# Patient Record
Sex: Female | Born: 1955 | Hispanic: No | Marital: Married | Smoking: Never smoker
Health system: Southern US, Community
[De-identification: ages and names within clinical notes are randomized; demographics above are authoritative.]

## PROBLEM LIST (undated history)

## (undated) DIAGNOSIS — M5126 Other intervertebral disc displacement, lumbar region: Secondary | ICD-10-CM

## (undated) DIAGNOSIS — E785 Hyperlipidemia, unspecified: Secondary | ICD-10-CM

## (undated) HISTORY — PX: ABDOMINAL HYSTERECTOMY: SHX81

## (undated) HISTORY — PX: HYSTEROTOMY: SHX1776

---

## 2017-02-13 ENCOUNTER — Emergency Department (HOSPITAL_COMMUNITY): Payer: No Typology Code available for payment source

## 2017-02-13 ENCOUNTER — Encounter (HOSPITAL_COMMUNITY): Payer: Self-pay | Admitting: Emergency Medicine

## 2017-02-13 ENCOUNTER — Emergency Department (HOSPITAL_COMMUNITY)
Admission: EM | Admit: 2017-02-13 | Discharge: 2017-02-13 | Disposition: A | Discharge status: Home / Self Care | Payer: No Typology Code available for payment source | Attending: Emergency Medicine | Admitting: Emergency Medicine

## 2017-02-13 DIAGNOSIS — Y999 Unspecified external cause status: Secondary | ICD-10-CM | POA: Diagnosis not present

## 2017-02-13 DIAGNOSIS — Y92009 Unspecified place in unspecified non-institutional (private) residence as the place of occurrence of the external cause: Secondary | ICD-10-CM | POA: Insufficient documentation

## 2017-02-13 DIAGNOSIS — R791 Abnormal coagulation profile: Secondary | ICD-10-CM | POA: Diagnosis not present

## 2017-02-13 DIAGNOSIS — S4992XA Unspecified injury of left shoulder and upper arm, initial encounter: Secondary | ICD-10-CM | POA: Diagnosis present

## 2017-02-13 DIAGNOSIS — R935 Abnormal findings on diagnostic imaging of other abdominal regions, including retroperitoneum: Secondary | ICD-10-CM | POA: Insufficient documentation

## 2017-02-13 DIAGNOSIS — M545 Low back pain: Secondary | ICD-10-CM | POA: Insufficient documentation

## 2017-02-13 DIAGNOSIS — S40812A Abrasion of left upper arm, initial encounter: Secondary | ICD-10-CM | POA: Insufficient documentation

## 2017-02-13 DIAGNOSIS — Y9301 Activity, walking, marching and hiking: Secondary | ICD-10-CM | POA: Insufficient documentation

## 2017-02-13 DIAGNOSIS — M7918 Myalgia, other site: Secondary | ICD-10-CM

## 2017-02-13 HISTORY — DX: Hyperlipidemia, unspecified: E78.5

## 2017-02-13 HISTORY — DX: Other intervertebral disc displacement, lumbar region: M51.26

## 2017-02-13 LAB — CBC
HEMATOCRIT: 41.4 % (ref 36.0–46.0)
Hemoglobin: 13.9 g/dL (ref 12.0–15.0)
MCH: 30 pg (ref 26.0–34.0)
MCHC: 33.6 g/dL (ref 30.0–36.0)
MCV: 89.4 fL (ref 78.0–100.0)
PLATELETS: 215 10*3/uL (ref 150–400)
RBC: 4.63 MIL/uL (ref 3.87–5.11)
RDW: 13.4 % (ref 11.5–15.5)
WBC: 8.6 10*3/uL (ref 4.0–10.5)

## 2017-02-13 LAB — COMPREHENSIVE METABOLIC PANEL
ALBUMIN: 4.4 g/dL (ref 3.5–5.0)
ALT: 31 U/L (ref 14–54)
AST: 35 U/L (ref 15–41)
Alkaline Phosphatase: 89 U/L (ref 38–126)
Anion gap: 11 (ref 5–15)
BILIRUBIN TOTAL: 0.8 mg/dL (ref 0.3–1.2)
BUN: 17 mg/dL (ref 6–20)
CHLORIDE: 104 mmol/L (ref 101–111)
CO2: 24 mmol/L (ref 22–32)
CREATININE: 0.92 mg/dL (ref 0.44–1.00)
Calcium: 9.6 mg/dL (ref 8.9–10.3)
GFR calc Af Amer: >60 mL/min (ref >60)
GFR calc non Af Amer: >60 mL/min (ref >60)
GLUCOSE: 90 mg/dL (ref 65–99)
POTASSIUM: 3.9 mmol/L (ref 3.5–5.1)
Sodium: 139 mmol/L (ref 135–145)
Total Protein: 7.9 g/dL (ref 6.5–8.1)

## 2017-02-13 LAB — I-STAT CHEM 8, ED
BUN: 22 mg/dL — AB (ref 6–20)
CREATININE: 0.9 mg/dL (ref 0.44–1.00)
Calcium, Ion: 1.18 mmol/L (ref 1.15–1.40)
Chloride: 106 mmol/L (ref 101–111)
Glucose, Bld: 91 mg/dL (ref 65–99)
HEMATOCRIT: 42 % (ref 36.0–46.0)
HEMOGLOBIN: 14.3 g/dL (ref 12.0–15.0)
POTASSIUM: 3.8 mmol/L (ref 3.5–5.1)
SODIUM: 142 mmol/L (ref 135–145)
TCO2: 25 mmol/L (ref 0–100)

## 2017-02-13 LAB — SAMPLE TO BLOOD BANK

## 2017-02-13 LAB — ETHANOL: Alcohol, Ethyl (B): <5 mg/dL (ref <5)

## 2017-02-13 LAB — PROTIME-INR
INR: 0.94
Prothrombin Time: 12.5 seconds (ref 11.4–15.2)

## 2017-02-13 LAB — CDS SEROLOGY

## 2017-02-13 LAB — I-STAT CG4 LACTIC ACID, ED: Lactic Acid, Venous: 1.29 mmol/L (ref 0.5–1.9)

## 2017-02-13 MED ORDER — OXYCODONE-ACETAMINOPHEN 5-325 MG PO TABS: 1.0000 | ORAL_TABLET | Freq: Four times a day (QID) | ORAL | 0 refills | Status: AC | PRN

## 2017-02-13 MED ORDER — IOPAMIDOL (ISOVUE-300) INJECTION 61%
INTRAVENOUS | Status: AC
  Administered 2017-02-13: 100 mL
  Filled 2017-02-13: qty 100

## 2017-02-13 MED ORDER — MORPHINE SULFATE (PF) 4 MG/ML IV SOLN
4.0000 mg | Freq: Once | INTRAVENOUS | Status: AC
  Administered 2017-02-13: 4 mg via INTRAVENOUS
  Filled 2017-02-13: qty 1

## 2017-02-13 MED ORDER — CYCLOBENZAPRINE HCL 10 MG PO TABS: 10.0000 mg | ORAL_TABLET | Freq: Two times a day (BID) | ORAL | 0 refills | Status: DC | PRN

## 2017-02-13 MED ORDER — ONDANSETRON HCL 4 MG/2ML IJ SOLN
4.0000 mg | Freq: Once | INTRAMUSCULAR | Status: AC
  Administered 2017-02-13: 4 mg via INTRAVENOUS
  Filled 2017-02-13: qty 2
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
## 2017-02-13 NOTE — ED Notes (Signed)
Pt ambulated approx 12 steps with standby assist. Pt had balanced, steady gait and denied any dizziness, weakness, lightheadedness. Pt noted to have difficulty and experienced pain in R hip while ambulating.
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
## 2017-02-13 NOTE — ED Notes (Signed)
Patient transported to CT 

## 2020-05-26 ENCOUNTER — Other Ambulatory Visit: Payer: Self-pay | Admitting: Physician Assistant

## 2020-05-26 DIAGNOSIS — M549 Dorsalgia, unspecified: Secondary | ICD-10-CM

## 2020-06-01 ENCOUNTER — Other Ambulatory Visit: Payer: Self-pay | Admitting: Physician Assistant

## 2020-06-01 DIAGNOSIS — I720 Aneurysm of carotid artery: Secondary | ICD-10-CM

## 2020-06-06 ENCOUNTER — Ambulatory Visit
Admission: RE | Admit: 2020-06-06 | Discharge: 2020-06-06 | Disposition: A | Discharge status: Home / Self Care | Payer: BLUE CROSS/BLUE SHIELD | Source: Ambulatory Visit | Attending: Physician Assistant | Admitting: Physician Assistant

## 2020-06-06 ENCOUNTER — Other Ambulatory Visit: Payer: Self-pay

## 2020-06-06 DIAGNOSIS — I720 Aneurysm of carotid artery: Secondary | ICD-10-CM

## 2020-06-06 MED ORDER — IOPAMIDOL (ISOVUE-370) INJECTION 76%
75.0000 mL | Freq: Once | INTRAVENOUS | Status: AC | PRN
  Administered 2020-06-06: 75 mL via INTRAVENOUS

## 2020-07-06 ENCOUNTER — Ambulatory Visit
Admission: RE | Admit: 2020-07-06 | Discharge: 2020-07-06 | Disposition: A | Discharge status: Home / Self Care | Payer: Managed Care, Other (non HMO) | Source: Ambulatory Visit | Attending: Physician Assistant | Admitting: Physician Assistant

## 2020-07-06 ENCOUNTER — Other Ambulatory Visit: Payer: Self-pay

## 2020-07-06 DIAGNOSIS — M549 Dorsalgia, unspecified: Secondary | ICD-10-CM

## 2021-07-05 ENCOUNTER — Other Ambulatory Visit: Payer: Self-pay | Admitting: Neurosurgery

## 2021-07-05 DIAGNOSIS — I720 Aneurysm of carotid artery: Secondary | ICD-10-CM

## 2021-07-20 ENCOUNTER — Other Ambulatory Visit: Payer: Self-pay

## 2021-07-20 ENCOUNTER — Ambulatory Visit
Admission: RE | Admit: 2021-07-20 | Discharge: 2021-07-20 | Disposition: A | Discharge status: Home / Self Care | Payer: Medicare HMO | Source: Ambulatory Visit | Attending: Neurosurgery | Admitting: Neurosurgery

## 2021-07-20 DIAGNOSIS — I720 Aneurysm of carotid artery: Secondary | ICD-10-CM

## 2021-07-20 MED ORDER — GADOBENATE DIMEGLUMINE 529 MG/ML IV SOLN
15.0000 mL | Freq: Once | INTRAVENOUS | Status: AC | PRN
  Administered 2021-07-20: 15 mL via INTRAVENOUS

## 2021-12-13 ENCOUNTER — Other Ambulatory Visit: Payer: Self-pay | Admitting: Obstetrics and Gynecology

## 2021-12-13 DIAGNOSIS — E2839 Other primary ovarian failure: Secondary | ICD-10-CM

## 2022-03-28 ENCOUNTER — Encounter: Payer: Self-pay | Admitting: Student

## 2022-03-28 ENCOUNTER — Ambulatory Visit: Payer: Medicare HMO | Admitting: Student

## 2022-03-28 VITALS — BP 130/87 | HR 72 | Temp 98.0°F | Resp 16 | Ht 65.0 in | Wt 142.0 lb

## 2022-03-28 DIAGNOSIS — E7800 Pure hypercholesterolemia, unspecified: Secondary | ICD-10-CM

## 2022-03-28 DIAGNOSIS — R0609 Other forms of dyspnea: Secondary | ICD-10-CM

## 2022-03-28 DIAGNOSIS — R072 Precordial pain: Secondary | ICD-10-CM

## 2022-03-28 NOTE — Progress Notes (Signed)
? ? ?Primary Physician/Referring:  Chesley Noon, MD ? ?Patient ID: Stacy Spencer, female    DOB: 11/21/1956, 66 y.o.   MRN: [id number] ? ?Chief Complaint  ?Patient presents with  ? Chest Pain  ? New Patient (Initial Visit)  ? ?HPI:   ? ?Stacy Spencer  is a 66 y.o. 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  She denies history of diabetes, tobacco use, hypertension (on metoprolol for PAC/PVCs).  Patient has history of cervical spine fusion as well as hysterectomy.  Patient's brother with likely MI at about age 64. ? ?Patient states she was seen by cardiologist in [place] in 2018 at which point she underwent cardiac catheterization, but reportedly did not receive stents.  At that time she also states she was referred to a cardiac electrophysiologist who started her on metoprolol. ? ?Patient now presents to our office for evaluation of intermittent chest pain as well as dyspnea on exertion over the last 3 months.  Patient describes substernal chest pain as "gnawing" occurring both at rest as well as occasionally with exertion.  Pain lasts less than 30 minutes and was relieved by aspirin.  She states she also has intermittent dyspnea on exertion when she goes up the stairs, however also reports it does not happen every time". ? ?Past Medical History:  ?Diagnosis Date  ? Hyperlipemia   ? Lumbar herniated disc   ? ?Past Surgical History:  ?Procedure Laterality Date  ? ABDOMINAL HYSTERECTOMY    ? HYSTEROTOMY    ? ?Family History  ?Problem Relation Age of Onset  ? Diabetes Father   ? Diabetes Sister   ? Diabetes Brother   ? Diabetes Brother   ?  ?Social History  ? ?Tobacco Use  ? Smoking status: Never  ? Smokeless tobacco: Never  ?Substance Use Topics  ? Alcohol use: Yes  ?  Comment: occ  ? ?Marital Status: Married  ? ?ROS  ?Review of Systems  ?Constitutional: Negative for malaise/fatigue and weight gain.  ?Cardiovascular:  Positive for  chest pain (intermittent) and dyspnea on exertion (occasional). Negative for claudication, leg swelling, near-syncope, orthopnea, palpitations, paroxysmal nocturnal dyspnea and syncope.  ?Neurological:  Negative for dizziness.  ? ?Objective  ?Blood pressure 130/87, pulse 72, temperature 98 ?F (36.7 ?C), resp. rate 16, height '5\' 5"'$  (1.651 m), weight 142 lb (64.4 kg), SpO2 98 %.  ? ?  03/28/2022  ? 12:40 PM 02/13/2017  ? 11:15 PM 02/13/2017  ? 11:00 PM  ?Vitals with BMI  ?Height '5\' 5"'$     ?Weight 142 lbs    ?BMI 23.63    ?Systolic 101 751 025  ?Diastolic 87 68 69  ?Pulse 72 66 66  ?  ? ? Physical Exam ?Vitals reviewed.  ?Cardiovascular:  ?   Rate and Rhythm: Normal rate and regular rhythm.  ?   Pulses: Intact distal pulses.     ?     Carotid pulses are 2+ on the right side and 2+ on the left side. ?     Dorsalis pedis pulses are 2+ on the right side and 2+ on the left side.  ?     Posterior tibial pulses are 2+ on the right side and 2+ on the left side.  ?   Heart sounds: S1 normal and S2 normal. No murmur heard. ?  No gallop.  ?Pulmonary:  ?   Effort: Pulmonary effort is normal. No respiratory distress.  ?  Breath sounds: No wheezing, rhonchi or rales.  ?Musculoskeletal:  ?   Right lower leg: No edema.  ?   Left lower leg: No edema.  ?Neurological:  ?   Mental Status: She is alert.  ? ? ?Laboratory examination:  ? ?No results for input(s): NA, K, CL, CO2, GLUCOSE, BUN, CREATININE, CALCIUM, GFRNONAA, GFRAA in the last 8760 hours. ?CrCl cannot be calculated (Patient's most recent lab result is older than the maximum 21 days allowed.).  ? ?  Latest Ref Rng & Units 02/13/2017  ?  8:37 PM 02/13/2017  ?  8:32 PM  ?CMP  ?Glucose 65 - 99 mg/dL 91   90    ?BUN 6 - 20 mg/dL 22   17    ?Creatinine 0.44 - 1.00 mg/dL 0.90   0.92    ?Sodium 135 - 145 mmol/L 142   139    ?Potassium 3.5 - 5.1 mmol/L 3.8   3.9    ?Chloride 101 - 111 mmol/L 106   104    ?CO2 22 - 32 mmol/L  24    ?Calcium 8.9 - 10.3 mg/dL  9.6    ?Total Protein 6.5 - 8.1  g/dL  7.9    ?Total Bilirubin 0.3 - 1.2 mg/dL  0.8    ?Alkaline Phos 38 - 126 U/L  89    ?AST 15 - 41 U/L  35    ?ALT 14 - 54 U/L  31    ? ? ?  Latest Ref Rng & Units 02/13/2017  ?  8:37 PM 02/13/2017  ?  8:32 PM  ?CBC  ?WBC 4.0 - 10.5 K/uL  8.6    ?Hemoglobin 12.0 - 15.0 g/dL 14.3   13.9    ?Hematocrit 36.0 - 46.0 % 42.0   41.4    ?Platelets 150 - 400 K/uL  215    ? ? ?Lipid Panel ?No results for input(s): CHOL, TRIG, LDLCALC, VLDL, HDL, CHOLHDL, LDLDIRECT in the last 8760 hours. ? ?HEMOGLOBIN A1C ?No results found for: HGBA1C, MPG ?TSH ?No results for input(s): TSH in the last 8760 hours. ? ?External labs:  ?03/27/2022: ?BUN 11, creatinine 0.89, GFR >60, sodium 142, potassium 4.3, AST 21, ALT 21 ?Total cholesterol 176, triglycerides 180, HDL 40, LDL 103 ?TSH 1.38 ?Vitamin D38.8 ? ?09/25/2021: ?Total cholesterol 186, triglycerides 153, HDL 46, LDL 113 ? ?Allergies  ?No Known Allergies  ? ? ?Medications Prior to Visit:  ? ?Outpatient Medications Prior to Visit  ?Medication Sig Dispense Refill  ? acetaminophen (TYLENOL) 500 MG tablet Take by mouth.    ? amitriptyline (ELAVIL) 25 MG tablet Take 25 mg by mouth at bedtime.    ? ascorbic acid (VITAMIN C) 1000 MG tablet Take 1,000 mg by mouth daily.    ? Aspirin (VAZALORE) 81 MG CAPS Take 1 tablet by mouth daily at 6 (six) AM.    ? atorvastatin (LIPITOR) 20 MG tablet Take 20 mg by mouth every evening.    ? Cholecalciferol 125 MCG (5000 UT) capsule Take 5,000 Units by mouth daily.    ? Docusate Sodium 100 MG capsule Take by mouth.    ? metoprolol succinate (TOPROL-XL) 50 MG 24 hr tablet Take 50 mg by mouth daily.    ? traZODone (DESYREL) 50 MG tablet Take 1/2 tablet ('25mg'$ ) to 1 tablet ('50mg'$ ) nightly as needed for insomnia    ? albuterol (PROVENTIL HFA;VENTOLIN HFA) 108 (90 Base) MCG/ACT inhaler Inhale 1-2 puffs into the lungs every 6 (six) hours as needed for wheezing  or shortness of breath.    ? cyclobenzaprine (FLEXERIL) 10 MG tablet Take 1 tablet (10 mg total) by mouth 2  (two) times daily as needed for muscle spasms. 20 tablet 0  ? ?No facility-administered medications prior to visit.  ? ?Final Medications at End of Visit   ? ?Current Meds  ?Medication Sig  ? acetaminophen (TYLENOL) 500 MG tablet Take by mouth.  ? amitriptyline (ELAVIL) 25 MG tablet Take 25 mg by mouth at bedtime.  ? ascorbic acid (VITAMIN C) 1000 MG tablet Take 1,000 mg by mouth daily.  ? Aspirin (VAZALORE) 81 MG CAPS Take 1 tablet by mouth daily at 6 (six) AM.  ? atorvastatin (LIPITOR) 20 MG tablet Take 20 mg by mouth every evening.  ? Cholecalciferol 125 MCG (5000 UT) capsule Take 5,000 Units by mouth daily.  ? Docusate Sodium 100 MG capsule Take by mouth.  ? metoprolol succinate (TOPROL-XL) 50 MG 24 hr tablet Take 50 mg by mouth daily.  ? traZODone (DESYREL) 50 MG tablet Take 1/2 tablet ('25mg'$ ) to 1 tablet ('50mg'$ ) nightly as needed for insomnia  ? ?Radiology:  ? ?No results found. ? ?Cardiac Studies:  ? ?[place] records are not available  ? ?EKG:  ? ?03/28/22: Sinus rhythm at a rate of 65 bpm.  Normal axis.  Incomplete right bundle branch block.  No evidence of ischemia or underlying injury pattern.  Nonspecific T wave abnormality. ? ?Assessment  ? ?  ICD-10-CM   ?1. Precordial pain  R07.2 EKG 12-Lead  ?  PCV ECHOCARDIOGRAM COMPLETE  ?  PCV MYOCARDIAL PERFUSION WO LEXISCAN  ?  ?2. Dyspnea on exertion  R06.09   ?  ?3. Hypercholesteremia  E78.00   ?  ?  ? ?Medications Discontinued During This Encounter  ?Medication Reason  ? albuterol (PROVENTIL HFA;VENTOLIN HFA) 108 (90 Base) MCG/ACT inhaler   ? cyclobenzaprine (FLEXERIL) 10 MG tablet   ?  ?No orders of the defined types were placed in this encounter. ? ? ?Recommendations:  ? ?Stacy Spencer is a 66 y.o. 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  She denies history of diabetes, tobacco use, hypertension (on metoprolol for PAC/PVCs).  Patient has history of cervical spine fusion as well as  hysterectomy.  Patient's brother with likely MI at about age 60. ? ?Patient is referred to our office for evaluation of intermittent chest pain and dyspnea on exertion.  Given patient's symptoms and multi

## 2022-04-03 ENCOUNTER — Ambulatory Visit: Payer: Medicare HMO

## 2022-04-03 DIAGNOSIS — R072 Precordial pain: Secondary | ICD-10-CM

## 2022-04-08 NOTE — Progress Notes (Signed)
Essentially normal echo, will discuss further at upcoming office visit.

## 2022-04-08 NOTE — Progress Notes (Signed)
Called pt to inform her about her echo results. Pt understood

## 2022-04-17 ENCOUNTER — Ambulatory Visit: Payer: Medicare HMO

## 2022-04-17 DIAGNOSIS — R072 Precordial pain: Secondary | ICD-10-CM

## 2022-04-24 ENCOUNTER — Other Ambulatory Visit: Payer: Self-pay | Admitting: Gastroenterology

## 2022-05-08 NOTE — Progress Notes (Signed)
? ? ?Primary Physician/Referring:  Chesley Noon, MD ? ?Patient ID: Stacy Spencer, female    DOB: 1956-03-09, 66 y.o.   MRN: [id number] ? ?Chief Complaint  ?Patient presents with  ? Results  ? Follow-up  ? ?HPI:   ? ?Stacy Spencer  is a 66 y.o. 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  She denies history of diabetes, tobacco use, hypertension (on metoprolol for PAC/PVCs).  Patient has history of cervical spine fusion as well as hysterectomy.  Patient's brother with likely MI at about age 42. ? ?Patient states she was seen by cardiologist in [place] in 2018 at which point she underwent cardiac catheterization, but reportedly did not receive stents.  At that time she also states she was referred to a cardiac electrophysiologist who started her on metoprolol. ? ?Patient presents for 6-week follow-up of chest pain and dyspnea on exertion.  At last office visit ordered echocardiogram Which revealed normal LVEF with grade 1 diastolic dysfunction, otherwise unremarkable.  Also ordered nuclear stress test which was overall low risk with normal myocardial perfusion.  Since last office visit patient has had no recurrence of shortness of breath or chest pain. ? ?Past Medical History:  ?Diagnosis Date  ? Hyperlipemia   ? Lumbar herniated disc   ? ?Past Surgical History:  ?Procedure Laterality Date  ? ABDOMINAL HYSTERECTOMY    ? HYSTEROTOMY    ? ?Family History  ?Problem Relation Age of Onset  ? Diabetes Father   ? Diabetes Sister   ? Diabetes Brother   ? Diabetes Brother   ?  ?Social History  ? ?Tobacco Use  ? Smoking status: Never  ? Smokeless tobacco: Never  ?Substance Use Topics  ? Alcohol use: Yes  ?  Comment: occ  ? ?Marital Status: Married  ? ?ROS  ?Review of Systems  ?Constitutional: Negative for malaise/fatigue and weight gain.  ?Cardiovascular:  Negative for chest pain, claudication, dyspnea on exertion, leg swelling, near-syncope,  orthopnea, palpitations, paroxysmal nocturnal dyspnea and syncope.  ?Neurological:  Negative for dizziness.  ? ?Objective  ?Blood pressure (!) 146/89, pulse 66, temperature 98 ?F (36.7 ?C), temperature source Temporal, resp. rate 16, height '5\' 5"'$  (1.651 m), weight 147 lb (66.7 kg), SpO2 98 %.  ? ?  05/09/2022  ?  9:59 AM 05/09/2022  ?  9:55 AM 03/28/2022  ? 12:40 PM  ?Vitals with BMI  ?Height  '5\' 5"'$  '5\' 5"'$   ?Weight  147 lbs 142 lbs  ?BMI  24.46 23.63  ?Systolic 782 956 213  ?Diastolic 89 84 87  ?Pulse 66 65 72  ?  ? ? Physical Exam ?Vitals reviewed.  ?Cardiovascular:  ?   Rate and Rhythm: Normal rate and regular rhythm.  ?   Pulses: Intact distal pulses.     ?     Carotid pulses are 2+ on the right side and 2+ on the left side. ?     Dorsalis pedis pulses are 2+ on the right side and 2+ on the left side.  ?     Posterior tibial pulses are 2+ on the right side and 2+ on the left side.  ?   Heart sounds: S1 normal and S2 normal. No murmur heard. ?  No gallop.  ?Pulmonary:  ?   Effort: Pulmonary effort is normal. No respiratory distress.  ?   Breath sounds: No wheezing, rhonchi or rales.  ?Musculoskeletal:  ?  Right lower leg: No edema.  ?   Left lower leg: No edema.  ?Neurological:  ?   Mental Status: She is alert.  ?Physical exam unchanged compared to previous office visit. ? ?Laboratory examination:  ? ?No results for input(s): NA, K, CL, CO2, GLUCOSE, BUN, CREATININE, CALCIUM, GFRNONAA, GFRAA in the last 8760 hours. ?CrCl cannot be calculated (Patient's most recent lab result is older than the maximum 21 days allowed.).  ? ?  Latest Ref Rng & Units 02/13/2017  ?  8:37 PM 02/13/2017  ?  8:32 PM  ?CMP  ?Glucose 65 - 99 mg/dL 91   90    ?BUN 6 - 20 mg/dL 22   17    ?Creatinine 0.44 - 1.00 mg/dL 0.90   0.92    ?Sodium 135 - 145 mmol/L 142   139    ?Potassium 3.5 - 5.1 mmol/L 3.8   3.9    ?Chloride 101 - 111 mmol/L 106   104    ?CO2 22 - 32 mmol/L  24    ?Calcium 8.9 - 10.3 mg/dL  9.6    ?Total Protein 6.5 - 8.1 g/dL  7.9     ?Total Bilirubin 0.3 - 1.2 mg/dL  0.8    ?Alkaline Phos 38 - 126 U/L  89    ?AST 15 - 41 U/L  35    ?ALT 14 - 54 U/L  31    ? ? ?  Latest Ref Rng & Units 02/13/2017  ?  8:37 PM 02/13/2017  ?  8:32 PM  ?CBC  ?WBC 4.0 - 10.5 K/uL  8.6    ?Hemoglobin 12.0 - 15.0 g/dL 14.3   13.9    ?Hematocrit 36.0 - 46.0 % 42.0   41.4    ?Platelets 150 - 400 K/uL  215    ? ? ?Lipid Panel ?No results for input(s): CHOL, TRIG, LDLCALC, VLDL, HDL, CHOLHDL, LDLDIRECT in the last 8760 hours. ? ?HEMOGLOBIN A1C ?No results found for: HGBA1C, MPG ?TSH ?No results for input(s): TSH in the last 8760 hours. ? ?External labs:  ?03/27/2022: ?BUN 11, creatinine 0.89, GFR >60, sodium 142, potassium 4.3, AST 21, ALT 21 ?Total cholesterol 176, triglycerides 180, HDL 40, LDL 103 ?TSH 1.38 ?Vitamin D38.8 ? ?09/25/2021: ?Total cholesterol 186, triglycerides 153, HDL 46, LDL 113 ? ?Allergies  ?No Known Allergies  ? ?Medications Prior to Visit:  ? ?Outpatient Medications Prior to Visit  ?Medication Sig Dispense Refill  ? acetaminophen (TYLENOL) 500 MG tablet Take by mouth.    ? amitriptyline (ELAVIL) 25 MG tablet Take 25 mg by mouth at bedtime.    ? ascorbic acid (VITAMIN C) 1000 MG tablet Take 1,000 mg by mouth daily.    ? atorvastatin (LIPITOR) 20 MG tablet Take 20 mg by mouth every evening.    ? cetirizine (ZYRTEC) 5 MG tablet Take 5 mg by mouth daily.    ? Cholecalciferol 125 MCG (5000 UT) capsule Take 5,000 Units by mouth daily.    ? Docusate Sodium 100 MG capsule Take by mouth.    ? metoprolol succinate (TOPROL-XL) 50 MG 24 hr tablet Take 50 mg by mouth daily.    ? traZODone (DESYREL) 50 MG tablet Take 1/2 tablet ('25mg'$ ) to 1 tablet ('50mg'$ ) nightly as needed for insomnia    ? Aspirin (VAZALORE) 81 MG CAPS Take 1 tablet by mouth daily at 6 (six) AM.    ? ?No facility-administered medications prior to visit.  ? ?Final Medications at End of Visit   ? ?  Current Meds  ?Medication Sig  ? acetaminophen (TYLENOL) 500 MG tablet Take by mouth.  ? amitriptyline  (ELAVIL) 25 MG tablet Take 25 mg by mouth at bedtime.  ? ascorbic acid (VITAMIN C) 1000 MG tablet Take 1,000 mg by mouth daily.  ? atorvastatin (LIPITOR) 20 MG tablet Take 20 mg by mouth every evening.  ? cetirizine (ZYRTEC) 5 MG tablet Take 5 mg by mouth daily.  ? Cholecalciferol 125 MCG (5000 UT) capsule Take 5,000 Units by mouth daily.  ? Docusate Sodium 100 MG capsule Take by mouth.  ? metoprolol succinate (TOPROL-XL) 50 MG 24 hr tablet Take 50 mg by mouth daily.  ? traZODone (DESYREL) 50 MG tablet Take 1/2 tablet ('25mg'$ ) to 1 tablet ('50mg'$ ) nightly as needed for insomnia  ? [DISCONTINUED] Aspirin (VAZALORE) 81 MG CAPS Take 1 tablet by mouth daily at 6 (six) AM.  ? ?Radiology:  ? ?No results found. ? ?Cardiac Studies:  ? ?PCV ECHOCARDIOGRAM COMPLETE 04/03/2022 ?Left ventricle cavity is normal in size and wall thickness. Normal global wall motion. Normal LV systolic function with EF 63%. Doppler evidence of grade I (impaired) diastolic dysfunction, normal LAP. ?Structurally normal trileaflet aortic valve.  Trace aortic regurgitation. ?Normal right atrial pressure. ?  ?PCV MYOCARDIAL PERFUSION WO LEXISCAN 04/17/2022 ?Exercise nuclear stress test was performed using Bruce protocol. Patient reached 7 METS, and 105% of age predicted maximum heart rate. Exercise capacity was low. No chest pain reported. Heart rate and hemodynamic response were normal. Stress EKG revealed no ischemic changes. ?Normal myocardial perfusion. Stress LVEF 87%. ?Low risk study. ? ?EKG:  ? ?03/28/22: Sinus rhythm at a rate of 65 bpm.  Normal axis.  Incomplete right bundle branch block.  No evidence of ischemia or underlying injury pattern.  Nonspecific T wave abnormality. ? ?Assessment  ? ?  ICD-10-CM   ?1. Precordial pain  R07.2   ?  ?2. Dyspnea on exertion  R06.09   ?  ?  ? ?Medications Discontinued During This Encounter  ?Medication Reason  ? Aspirin (VAZALORE) 81 MG CAPS Discontinued by provider  ?  ?No orders of the defined types were  placed in this encounter. ? ? ?Recommendations:  ? ?Stacy Spencer is a 66 y.o. 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  She denies

## 2022-05-09 ENCOUNTER — Encounter: Payer: Self-pay | Admitting: Student

## 2022-05-09 ENCOUNTER — Ambulatory Visit: Payer: Medicare HMO | Admitting: Student

## 2022-05-09 VITALS — BP 146/89 | HR 66 | Temp 98.0°F | Resp 16 | Ht 65.0 in | Wt 147.0 lb

## 2022-05-09 DIAGNOSIS — R072 Precordial pain: Secondary | ICD-10-CM

## 2022-05-09 DIAGNOSIS — R0609 Other forms of dyspnea: Secondary | ICD-10-CM

## 2022-06-04 ENCOUNTER — Other Ambulatory Visit: Payer: Medicare HMO

## 2022-07-10 ENCOUNTER — Other Ambulatory Visit: Payer: Self-pay | Admitting: Gastroenterology

## 2022-07-19 ENCOUNTER — Encounter (HOSPITAL_COMMUNITY): Payer: Self-pay | Admitting: Gastroenterology

## 2022-07-26 ENCOUNTER — Encounter (HOSPITAL_COMMUNITY): Payer: Self-pay

## 2022-07-26 ENCOUNTER — Ambulatory Visit (HOSPITAL_COMMUNITY)
Admission: RE | Admit: 2022-07-26 | Discharge: 2022-07-26 | Disposition: A | Discharge status: Home / Self Care | Payer: Medicare HMO | Attending: Gastroenterology | Admitting: Gastroenterology

## 2022-07-26 ENCOUNTER — Ambulatory Visit (HOSPITAL_COMMUNITY): Payer: Medicare HMO | Admitting: Anesthesiology

## 2022-07-26 ENCOUNTER — Encounter (HOSPITAL_COMMUNITY): Payer: Self-pay | Admitting: Gastroenterology

## 2022-07-26 ENCOUNTER — Ambulatory Visit (HOSPITAL_BASED_OUTPATIENT_CLINIC_OR_DEPARTMENT_OTHER): Payer: Medicare HMO | Admitting: Anesthesiology

## 2022-07-26 ENCOUNTER — Encounter (HOSPITAL_COMMUNITY)
Admission: RE | Disposition: A | Discharge status: Home / Self Care | Payer: Self-pay | Source: Home / Self Care | Attending: Gastroenterology

## 2022-07-26 ENCOUNTER — Ambulatory Visit (HOSPITAL_COMMUNITY): Admit: 2022-07-26 | Payer: Medicare HMO | Admitting: Gastroenterology

## 2022-07-26 ENCOUNTER — Other Ambulatory Visit: Payer: Self-pay

## 2022-07-26 DIAGNOSIS — Z86010 Personal history of colonic polyps: Secondary | ICD-10-CM | POA: Diagnosis not present

## 2022-07-26 DIAGNOSIS — Z1211 Encounter for screening for malignant neoplasm of colon: Secondary | ICD-10-CM

## 2022-07-26 DIAGNOSIS — D122 Benign neoplasm of ascending colon: Secondary | ICD-10-CM | POA: Insufficient documentation

## 2022-07-26 DIAGNOSIS — K635 Polyp of colon: Secondary | ICD-10-CM | POA: Diagnosis not present

## 2022-07-26 HISTORY — PX: POLYPECTOMY: SHX5525

## 2022-07-26 HISTORY — PX: COLONOSCOPY WITH PROPOFOL: SHX5780

## 2022-07-26 SURGERY — COLONOSCOPY WITH PROPOFOL
Anesthesia: Monitor Anesthesia Care

## 2022-07-26 MED ORDER — PROPOFOL 1000 MG/100ML IV EMUL
INTRAVENOUS | Status: AC
  Filled 2022-07-26: qty 100

## 2022-07-26 MED ORDER — PROPOFOL 500 MG/50ML IV EMUL
INTRAVENOUS | Status: DC | PRN
  Administered 2022-07-26: 100 ug/kg/min via INTRAVENOUS

## 2022-07-26 MED ORDER — SODIUM CHLORIDE 0.9 % IV SOLN: INTRAVENOUS | Status: DC

## 2022-07-26 MED ORDER — PROPOFOL 10 MG/ML IV BOLUS
INTRAVENOUS | Status: DC | PRN
  Administered 2022-07-26 (×4): 20 mg via INTRAVENOUS

## 2022-07-26 MED ORDER — PROPOFOL 10 MG/ML IV BOLUS
INTRAVENOUS | Status: AC
  Filled 2022-07-26: qty 20

## 2022-07-26 MED ORDER — LACTATED RINGERS IV SOLN
INTRAVENOUS | Status: DC
  Administered 2022-07-26: 1000 mL via INTRAVENOUS

## 2022-07-26 MED ORDER — LIDOCAINE 2% (20 MG/ML) 5 ML SYRINGE
INTRAMUSCULAR | Status: DC | PRN
  Administered 2022-07-26: 100 mg via INTRAVENOUS

## 2022-07-26 SURGICAL SUPPLY — 22 items

## 2022-07-26 NOTE — Anesthesia Preprocedure Evaluation (Signed)
Anesthesia Evaluation  Patient identified by MRN, date of birth, ID band Patient awake    Reviewed: Allergy & Precautions, NPO status , Patient's Chart, lab work & pertinent test results  Airway Mallampati: II  TM Distance: >3 FB Neck ROM: Full    Dental no notable dental hx.    Pulmonary neg pulmonary ROS,    Pulmonary exam normal breath sounds clear to auscultation       Cardiovascular negative cardio ROS Normal cardiovascular exam Rhythm:Regular Rate:Normal     Neuro/Psych negative neurological ROS  negative psych ROS   GI/Hepatic negative GI ROS, Neg liver ROS,   Endo/Other  negative endocrine ROS  Renal/GU negative Renal ROS  negative genitourinary   Musculoskeletal negative musculoskeletal ROS (+)   Abdominal   Peds negative pediatric ROS (+)  Hematology negative hematology ROS (+)   Anesthesia Other Findings   Reproductive/Obstetrics negative OB ROS                             Anesthesia Physical Anesthesia Plan  ASA: 2  Anesthesia Plan: MAC   Post-op Pain Management: Minimal or no pain anticipated   Induction: Intravenous  PONV Risk Score and Plan: 2 and Propofol infusion, Ondansetron and Treatment may vary due to age or medical condition  Airway Management Planned: Simple Face Mask  Additional Equipment:   Intra-op Plan:   Post-operative Plan:   Informed Consent: I have reviewed the patients History and Physical, chart, labs and discussed the procedure including the risks, benefits and alternatives for the proposed anesthesia with the patient or authorized representative who has indicated his/her understanding and acceptance.     Dental advisory given  Plan Discussed with: CRNA  Anesthesia Plan Comments:         Anesthesia Quick Evaluation
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
## 2022-07-26 NOTE — Anesthesia Postprocedure Evaluation (Signed)
Anesthesia Post Note  Patient: Stacy Spencer  Procedure(s) Performed: COLONOSCOPY WITH PROPOFOL POLYPECTOMY     Patient location during evaluation: PACU Anesthesia Type: MAC Level of consciousness: awake and alert Pain management: pain level controlled Vital Signs Assessment: post-procedure vital signs reviewed and stable Respiratory status: spontaneous breathing, nonlabored ventilation and respiratory function stable Cardiovascular status: blood pressure returned to baseline and stable Postop Assessment: no apparent nausea or vomiting Anesthetic complications: no   No notable events documented.  Last Vitals:  Vitals:   07/26/22 0830 07/26/22 0831  BP: (!) 150/82   Pulse: 64 64  Resp: 15 15  Temp:    SpO2: 100% 100%    Last Pain:  Vitals:   07/26/22 0812  TempSrc: Oral  PainSc:                  Lynda Rainwater

## 2022-07-26 NOTE — Op Note (Signed)
[facility] Patient Name: Stacy Spencer Procedure Date: 07/26/2022 MRN: [id number] Attending MD: Carol Ada , MD Date of Birth: 1956/06/19 CSN: [id number] Age: 66 Admit Type: Outpatient Procedure:                Colonoscopy Indications:              High risk colon cancer surveillance: Personal                            history of colonic polyps Providers:                Carol Ada, MD, Burtis Junes, RN, Fransico Setters Mbumina,                            Technician, Darliss Cheney, Technician Referring MD:              Medicines:                Propofol per Anesthesia Complications:            No immediate complications. Estimated Blood Loss:     Estimated blood loss: none. Procedure:                Pre-Anesthesia Assessment:                           - Prior to the procedure, a History and Physical                            was performed, and patient medications and                            allergies were reviewed. The patient's tolerance of                            previous anesthesia was also reviewed. The risks                            and benefits of the procedure and the sedation                            options and risks were discussed with the patient.                            All questions were answered, and informed consent                            was obtained. Prior Anticoagulants: The patient has                            taken no previous anticoagulant or antiplatelet                            agents. ASA Grade Assessment: II - A patient with  mild systemic disease. After reviewing the risks                            and benefits, the patient was deemed in                            satisfactory condition to undergo the procedure.                           - Sedation was administered by an anesthesia                            professional. Deep sedation was attained.                           After obtaining informed  consent, the colonoscope                            was passed under direct vision. Throughout the                            procedure, the patient's blood pressure, pulse, and                            oxygen saturations were monitored continuously. The                            CF-HQ190L ([id number] Olympus colonoscope was                            introduced through the anus and advanced to the the                            cecum, identified by appendiceal orifice and                            ileocecal valve. The colonoscopy was performed                            without difficulty. The patient tolerated the                            procedure well. The quality of the bowel                            preparation was evaluated using the BBPS [facility]                            Bowel Preparation Scale) with scores of: Right                            Colon = 3 (entire mucosa seen well with no residual  staining, small fragments of stool or opaque                            liquid), Transverse Colon = 3 (entire mucosa seen                            well with no residual staining, small fragments of                            stool or opaque liquid) and Left Colon = 3 (entire                            mucosa seen well with no residual staining, small                            fragments of stool or opaque liquid). The total                            BBPS score equals 9. The quality of the bowel                            preparation was good. The ileocecal valve,                            appendiceal orifice, and rectum were photographed. Scope In: 7:43:46 AM Scope Out: 8:05:25 AM Scope Withdrawal Time: 0 hours 18 minutes 21 seconds  Total Procedure Duration: 0 hours 21 minutes 39 seconds  Findings:      Two sessile polyps were found in the ascending colon. The polyps were 2       to 3 mm in size. These polyps were removed with a cold snare. Resection        and retrieval were complete. Impression:               - Two 2 to 3 mm polyps in the ascending colon,                            removed with a cold snare. Resected and retrieved. Moderate Sedation:      Not Applicable - Patient had care per Anesthesia. Recommendation:           - Patient has a contact number available for                            emergencies. The signs and symptoms of potential                            delayed complications were discussed with the                            patient. Return to normal activities tomorrow.                            Written discharge instructions were provided to the  patient.                           - Resume previous diet.                           - Continue present medications.                           - Await pathology results.                           - Repeat colonoscopy in 7 years for surveillance. Procedure Code(s):        --- Professional ---                           [phone], Colonoscopy, flexible; with removal of                            tumor(s), polyp(s), or other lesion(s) by snare                            technique Diagnosis Code(s):        --- Professional ---                           K63.5, Polyp of colon                           Z86.010, Personal history of colonic polyps CPT copyright 2019 [employer]. All rights reserved. The codes documented in this report are preliminary and upon coder review may  be revised to meet current compliance requirements. Carol Ada, MD Carol Ada, MD 07/26/2022 8:15:38 AM This report has been signed electronically. Number of Addenda: 0

## 2022-07-26 NOTE — Transfer of Care (Signed)
Immediate Anesthesia Transfer of Care Note  Patient: Stacy Spencer  Procedure(s) Performed: COLONOSCOPY WITH PROPOFOL POLYPECTOMY  Patient Location: Endoscopy Unit  Anesthesia Type:MAC  Level of Consciousness: awake and alert   Airway & Oxygen Therapy: Patient Spontanous Breathing and Patient connected to face mask oxygen  Post-op Assessment: Report given to RN and Post -op Vital signs reviewed and stable  Post vital signs: Reviewed and stable  Last Vitals:  Vitals Value Taken Time  BP    Temp    Pulse    Resp 19 07/26/22 0810  SpO2    Vitals shown include unvalidated device data.  Last Pain:  Vitals:   07/26/22 0637  TempSrc: Oral  PainSc: 0-No pain         Complications: No notable events documented.

## 2022-07-26 NOTE — Discharge Instructions (Signed)

## 2022-07-29 ENCOUNTER — Encounter (HOSPITAL_COMMUNITY): Payer: Self-pay | Admitting: Gastroenterology

## 2022-07-29 LAB — SURGICAL PATHOLOGY

## 2023-02-05 ENCOUNTER — Emergency Department (HOSPITAL_COMMUNITY)
Admission: EM | Admit: 2023-02-05 | Discharge: 2023-02-05 | Disposition: A | Discharge status: Home / Self Care | Payer: Medicare HMO | Attending: Emergency Medicine | Admitting: Emergency Medicine

## 2023-02-05 ENCOUNTER — Emergency Department (HOSPITAL_COMMUNITY): Payer: Medicare HMO

## 2023-02-05 ENCOUNTER — Other Ambulatory Visit: Payer: Self-pay

## 2023-02-05 DIAGNOSIS — M545 Low back pain, unspecified: Secondary | ICD-10-CM | POA: Insufficient documentation

## 2023-02-05 DIAGNOSIS — Z7982 Long term (current) use of aspirin: Secondary | ICD-10-CM | POA: Diagnosis not present

## 2023-02-05 DIAGNOSIS — R10819 Abdominal tenderness, unspecified site: Secondary | ICD-10-CM | POA: Diagnosis not present

## 2023-02-05 DIAGNOSIS — M542 Cervicalgia: Secondary | ICD-10-CM | POA: Diagnosis present

## 2023-02-05 DIAGNOSIS — I700 Atherosclerosis of aorta: Secondary | ICD-10-CM | POA: Insufficient documentation

## 2023-02-05 DIAGNOSIS — M549 Dorsalgia, unspecified: Secondary | ICD-10-CM | POA: Diagnosis not present

## 2023-02-05 DIAGNOSIS — R200 Anesthesia of skin: Secondary | ICD-10-CM | POA: Diagnosis not present

## 2023-02-05 DIAGNOSIS — Y92410 Unspecified street and highway as the place of occurrence of the external cause: Secondary | ICD-10-CM | POA: Insufficient documentation

## 2023-02-05 DIAGNOSIS — R079 Chest pain, unspecified: Secondary | ICD-10-CM | POA: Diagnosis not present

## 2023-02-05 LAB — URINALYSIS, ROUTINE W REFLEX MICROSCOPIC
Bilirubin Urine: NEGATIVE
Glucose, UA: NEGATIVE mg/dL
Hgb urine dipstick: NEGATIVE
Ketones, ur: NEGATIVE mg/dL
Leukocytes,Ua: NEGATIVE
Nitrite: NEGATIVE
Protein, ur: NEGATIVE mg/dL
Specific Gravity, Urine: 1.027 (ref 1.005–1.030)
pH: 5 (ref 5.0–8.0)

## 2023-02-05 LAB — CBC
HCT: 42.2 % (ref 36.0–46.0)
Hemoglobin: 13.8 g/dL (ref 12.0–15.0)
MCH: 30.4 pg (ref 26.0–34.0)
MCHC: 32.7 g/dL (ref 30.0–36.0)
MCV: 93 fL (ref 80.0–100.0)
Platelets: 193 10*3/uL (ref 150–400)
RBC: 4.54 MIL/uL (ref 3.87–5.11)
RDW: 13.2 % (ref 11.5–15.5)
WBC: 6 10*3/uL (ref 4.0–10.5)
nRBC: 0 % (ref 0.0–0.2)

## 2023-02-05 LAB — I-STAT CHEM 8, ED
BUN: 20 mg/dL (ref 8–23)
Calcium, Ion: 1.18 mmol/L (ref 1.15–1.40)
Chloride: 109 mmol/L (ref 98–111)
Creatinine, Ser: 0.7 mg/dL (ref 0.44–1.00)
Glucose, Bld: 95 mg/dL (ref 70–99)
HCT: 42 % (ref 36.0–46.0)
Hemoglobin: 14.3 g/dL (ref 12.0–15.0)
Potassium: 3.6 mmol/L (ref 3.5–5.1)
Sodium: 144 mmol/L (ref 135–145)
TCO2: 25 mmol/L (ref 22–32)

## 2023-02-05 LAB — COMPREHENSIVE METABOLIC PANEL
ALT: 28 U/L (ref 0–44)
AST: 32 U/L (ref 15–41)
Albumin: 4 g/dL (ref 3.5–5.0)
Alkaline Phosphatase: 103 U/L (ref 38–126)
Anion gap: 10 (ref 5–15)
BUN: 17 mg/dL (ref 8–23)
CO2: 24 mmol/L (ref 22–32)
Calcium: 9.4 mg/dL (ref 8.9–10.3)
Chloride: 107 mmol/L (ref 98–111)
Creatinine, Ser: 0.82 mg/dL (ref 0.44–1.00)
GFR, Estimated: >60 mL/min (ref >60)
Glucose, Bld: 98 mg/dL (ref 70–99)
Potassium: 3.6 mmol/L (ref 3.5–5.1)
Sodium: 141 mmol/L (ref 135–145)
Total Bilirubin: 0.6 mg/dL (ref 0.3–1.2)
Total Protein: 7.2 g/dL (ref 6.5–8.1)

## 2023-02-05 LAB — PROTIME-INR
INR: 1 (ref 0.8–1.2)
Prothrombin Time: 12.7 seconds (ref 11.4–15.2)

## 2023-02-05 LAB — LACTIC ACID, PLASMA: Lactic Acid, Venous: 1.6 mmol/L (ref 0.5–1.9)

## 2023-02-05 LAB — ETHANOL: Alcohol, Ethyl (B): <10 mg/dL (ref <10)

## 2023-02-05 MED ORDER — OXYCODONE-ACETAMINOPHEN 5-325 MG PO TABS: 1.0000 | ORAL_TABLET | Freq: Four times a day (QID) | ORAL | 0 refills | Status: DC | PRN

## 2023-02-05 MED ORDER — FENTANYL CITRATE PF 50 MCG/ML IJ SOSY
25.0000 ug | PREFILLED_SYRINGE | INTRAMUSCULAR | Status: DC | PRN
  Administered 2023-02-05 (×3): 25 ug via INTRAVENOUS
  Filled 2023-02-05 (×3): qty 1

## 2023-02-05 MED ORDER — IOHEXOL 350 MG/ML SOLN
65.0000 mL | Freq: Once | INTRAVENOUS | Status: AC | PRN
  Administered 2023-02-05: 65 mL via INTRAVENOUS

## 2023-02-05 MED ORDER — OXYCODONE-ACETAMINOPHEN 5-325 MG PO TABS
1.0000 | ORAL_TABLET | Freq: Once | ORAL | Status: AC
  Administered 2023-02-05: 1 via ORAL
  Filled 2023-02-05: qty 1

## 2023-02-05 MED ORDER — IBUPROFEN 400 MG PO TABS: 400.0000 mg | ORAL_TABLET | Freq: Three times a day (TID) | ORAL | 0 refills | Status: AC

## 2023-02-05 MED ORDER — ONDANSETRON HCL 4 MG/2ML IJ SOLN
4.0000 mg | Freq: Four times a day (QID) | INTRAMUSCULAR | Status: DC | PRN
  Administered 2023-02-05: 4 mg via INTRAVENOUS
  Filled 2023-02-05: qty 2

## 2023-02-05 MED ORDER — KETOROLAC TROMETHAMINE 15 MG/ML IJ SOLN
15.0000 mg | Freq: Once | INTRAMUSCULAR | Status: AC
  Administered 2023-02-05: 15 mg via INTRAVENOUS
  Filled 2023-02-05: qty 1

## 2023-02-05 NOTE — ED Notes (Signed)
Pt able to ambulate and transfer to bedside commode.
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
## 2023-02-05 NOTE — ED Notes (Signed)
Spoke to pts daughter Benjamine Mola; update provided. Pts daughter on the way to pick her up.

## 2023-02-05 NOTE — ED Notes (Signed)
X-ray at bedside
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
## 2023-05-12 ENCOUNTER — Ambulatory Visit: Payer: Medicare HMO | Admitting: Internal Medicine

## 2023-05-12 ENCOUNTER — Ambulatory Visit: Payer: Medicare HMO | Admitting: Student

## 2023-05-12 ENCOUNTER — Encounter: Payer: Self-pay | Admitting: Internal Medicine

## 2023-05-12 VITALS — BP 147/82 | HR 61 | Ht 65.0 in | Wt 147.6 lb

## 2023-05-12 DIAGNOSIS — R072 Precordial pain: Secondary | ICD-10-CM

## 2023-05-12 DIAGNOSIS — I10 Essential (primary) hypertension: Secondary | ICD-10-CM

## 2023-05-12 DIAGNOSIS — E7800 Pure hypercholesterolemia, unspecified: Secondary | ICD-10-CM

## 2023-05-12 NOTE — Progress Notes (Signed)
Primary Physician/Referring:  Eartha Inch, MD  Patient ID: Stacy Spencer, female    DOB: [birth year], 66 y.o.   MRN: [id number]  Chief Complaint  Patient presents with   Chest Pain   HPI:    Stacy Spencer  is a 67 y.o. 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  She denies history of diabetes, tobacco use, hypertension (on metoprolol for PAC/PVCs).  Patient has history of cervical spine fusion as well as hysterectomy.  Patient's brother with likely MI at about age 5.  Patient presents for her annual follow-up visit. She has been doing really well since the last time she was here. She is trying to walk more and stay active. Patient follows a healthy diet and overall she feels good. She does not have any symptoms with activity or high exertion. She is tolerating her medications without issues. Denies chest pain, shortness of breath, palpitations, diaphoresis, syncope, edema, PND, orthopnea.   Past Medical History:  Diagnosis Date   Hyperlipemia    Lumbar herniated disc    Past Surgical History:  Procedure Laterality Date   ABDOMINAL HYSTERECTOMY     COLONOSCOPY WITH PROPOFOL N/A 07/26/2022   Procedure: COLONOSCOPY WITH PROPOFOL;  Surgeon: Jeani Hawking, MD;  Location: [facility];  Service: Gastroenterology;  Laterality: N/A;   HYSTEROTOMY     POLYPECTOMY  07/26/2022   Procedure: POLYPECTOMY;  Surgeon: Jeani Hawking, MD;  Location: [facility];  Service: Gastroenterology;;   Family History  Problem Relation Age of Onset   Diabetes Father    Diabetes Sister    Diabetes Brother    Diabetes Brother     Social History   Tobacco Use   Smoking status: Never   Smokeless tobacco: Never  Substance Use Topics   Alcohol use: Yes    Comment: occ   Marital Status: Married   ROS  Review of Systems  Constitutional: Negative for malaise/fatigue and weight gain.  Cardiovascular:  Negative for  chest pain, claudication, dyspnea on exertion, leg swelling, near-syncope, orthopnea, palpitations, paroxysmal nocturnal dyspnea and syncope.  Neurological:  Negative for dizziness.    Objective  Blood pressure (!) 147/82, pulse 61, height 5\' 5"  (1.651 m), weight 147 lb 9.6 oz (67 kg), SpO2 98 %.     05/12/2023    9:53 AM 05/12/2023    9:46 AM 02/05/2023   10:00 PM  Vitals with BMI  Height  5\' 5"    Weight  147 lbs 10 oz   BMI  24.56   Systolic 147 149 782  Diastolic 82 89 69  Pulse 61 63 65      Physical Exam Vitals reviewed.  Cardiovascular:     Rate and Rhythm: Normal rate and regular rhythm.     Pulses: Intact distal pulses.          Carotid pulses are 2+ on the right side and 2+ on the left side.      Dorsalis pedis pulses are 2+ on the right side and 2+ on the left side.       Posterior tibial pulses are 2+ on the right side and 2+ on the left side.     Heart sounds: S1 normal and S2 normal. No murmur heard.    No gallop.  Pulmonary:     Effort: Pulmonary effort is normal. No respiratory distress.     Breath sounds: No wheezing, rhonchi or rales.  Musculoskeletal:  Right lower leg: No edema.     Left lower leg: No edema.  Neurological:     Mental Status: She is alert.   Physical exam unchanged compared to previous office visit.  Laboratory examination:   Recent Labs    02/05/23 1613 02/05/23 1621  NA 141 144  K 3.6 3.6  CL 107 109  CO2 24  --   GLUCOSE 98 95  BUN 17 20  CREATININE 0.82 0.70  CALCIUM 9.4  --   GFRNONAA >60  --    CrCl cannot be calculated (Patient's most recent lab result is older than the maximum 21 days allowed.).     Latest Ref Rng & Units 02/05/2023    4:21 PM 02/05/2023    4:13 PM 02/13/2017    8:37 PM  CMP  Glucose 70 - 99 mg/dL 95  98  91   BUN 8 - 23 mg/dL 20  17  22    Creatinine 0.44 - 1.00 mg/dL 1.61  0.96  0.45   Sodium 135 - 145 mmol/L 144  141  142   Potassium 3.5 - 5.1 mmol/L 3.6  3.6  3.8   Chloride 98 - 111 mmol/L 109   107  106   CO2 22 - 32 mmol/L  24    Calcium 8.9 - 10.3 mg/dL  9.4    Total Protein 6.5 - 8.1 g/dL  7.2    Total Bilirubin 0.3 - 1.2 mg/dL  0.6    Alkaline Phos 38 - 126 U/L  103    AST 15 - 41 U/L  32    ALT 0 - 44 U/L  28        Latest Ref Rng & Units 02/05/2023    4:21 PM 02/05/2023    4:13 PM 02/13/2017    8:37 PM  CBC  WBC 4.0 - 10.5 K/uL  6.0    Hemoglobin 12.0 - 15.0 g/dL 40.9  81.1  91.4   Hematocrit 36.0 - 46.0 % 42.0  42.2  42.0   Platelets 150 - 400 K/uL  193      Lipid Panel No results for input(s): "CHOL", "TRIG", "LDLCALC", "VLDL", "HDL", "CHOLHDL", "LDLDIRECT" in the last 8760 hours.  HEMOGLOBIN A1C No results found for: "HGBA1C", "MPG" TSH No results for input(s): "TSH" in the last 8760 hours.  External labs:  03/27/2022: BUN 11, creatinine 0.89, GFR >60, sodium 142, potassium 4.3, AST 21, ALT 21 Total cholesterol 176, triglycerides 180, HDL 40, LDL 103 TSH 1.38 Vitamin D38.8  09/25/2021: Total cholesterol 186, triglycerides 153, HDL 46, LDL 113  Allergies   Allergies  Allergen Reactions   Hydromorphone Itching     Medications Prior to Visit:   Outpatient Medications Prior to Visit  Medication Sig Dispense Refill   acetaminophen (TYLENOL) 500 MG tablet Take 1,000 mg by mouth every 8 (eight) hours as needed for moderate pain.     ascorbic acid (VITAMIN C) 1000 MG tablet Take 1,000 mg by mouth daily.     aspirin EC 81 MG tablet Take 81 mg by mouth daily. Swallow whole.     atorvastatin (LIPITOR) 20 MG tablet Take 20 mg by mouth every evening.     Cholecalciferol 125 MCG (5000 UT) capsule Take 5,000 Units by mouth daily.     fluticasone (FLONASE) 50 MCG/ACT nasal spray Place 1 spray into both nostrils daily as needed for allergies or rhinitis.     levocetirizine (XYZAL) 5 MG tablet Take 5 mg by mouth daily  as needed for allergies.     Melatonin 10 MG TABS Take 5 mg by mouth at bedtime as needed (sleep).     metoprolol succinate (TOPROL-XL) 50 MG 24 hr  tablet Take 50 mg by mouth daily.     oxyCODONE-acetaminophen (PERCOCET/ROXICET) 5-325 MG tablet Take 1 tablet by mouth every 6 (six) hours as needed for severe pain. 10 tablet 0   traZODone (DESYREL) 50 MG tablet Take 25 mg by mouth at bedtime as needed for sleep.     No facility-administered medications prior to visit.   Final Medications at End of Visit    Current Meds  Medication Sig   acetaminophen (TYLENOL) 500 MG tablet Take 1,000 mg by mouth every 8 (eight) hours as needed for moderate pain.   ascorbic acid (VITAMIN C) 1000 MG tablet Take 1,000 mg by mouth daily.   aspirin EC 81 MG tablet Take 81 mg by mouth daily. Swallow whole.   atorvastatin (LIPITOR) 20 MG tablet Take 20 mg by mouth every evening.   Cholecalciferol 125 MCG (5000 UT) capsule Take 5,000 Units by mouth daily.   fluticasone (FLONASE) 50 MCG/ACT nasal spray Place 1 spray into both nostrils daily as needed for allergies or rhinitis.   levocetirizine (XYZAL) 5 MG tablet Take 5 mg by mouth daily as needed for allergies.   Melatonin 10 MG TABS Take 5 mg by mouth at bedtime as needed (sleep).   metoprolol succinate (TOPROL-XL) 50 MG 24 hr tablet Take 50 mg by mouth daily.   oxyCODONE-acetaminophen (PERCOCET/ROXICET) 5-325 MG tablet Take 1 tablet by mouth every 6 (six) hours as needed for severe pain.   traZODone (DESYREL) 50 MG tablet Take 25 mg by mouth at bedtime as needed for sleep.   Radiology:   No results found.  Cardiac Studies:   PCV ECHOCARDIOGRAM COMPLETE 04/03/2022 Left ventricle cavity is normal in size and wall thickness. Normal global wall motion. Normal LV systolic function with EF 63%. Doppler evidence of grade I (impaired) diastolic dysfunction, normal LAP. Structurally normal trileaflet aortic valve.  Trace aortic regurgitation. Normal right atrial pressure.   PCV MYOCARDIAL PERFUSION WO LEXISCAN 04/17/2022 Exercise nuclear stress test was performed using Bruce protocol. Patient reached 7 METS,  and 105% of age predicted maximum heart rate. Exercise capacity was low. No chest pain reported. Heart rate and hemodynamic response were normal. Stress EKG revealed no ischemic changes. Normal myocardial perfusion. Stress LVEF 87%. Low risk study.  EKG:   03/28/22: Sinus rhythm at a rate of 65 bpm.  Normal axis.  Incomplete right bundle branch block.  No evidence of ischemia or underlying injury pattern.  Nonspecific T wave abnormality.  05/12/2023: normal sinus rhythm, Incomplete RBBB. No ischemia. Unchanged compared to prior  Assessment     ICD-10-CM   1. Precordial pain  R07.2 EKG 12-Lead    2. Hypercholesteremia  E78.00     3. Essential hypertension  I10        There are no discontinued medications.   No orders of the defined types were placed in this encounter.   Recommendations:   Stacy Spencer is a 67 y.o. 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  She denies history of diabetes, tobacco use, hypertension (on metoprolol for PAC/PVCs).  Patient has history of cervical spine fusion as well as hysterectomy.  Patient's brother with likely MI at about age 49.  Precordial pain She has had not recurrence of  this and continues to be chest pain free No dyspnea on exertion Testing last year was negative for ischemia  Hypercholesteremia Continue lipitor 20 mg daily PCP is monitoring lipids  Hypercholesteremia Blood pressure is well controlled at home She is always nervous at the doctors office She is doing well on metoprolol Follow-up in 1 year, sooner if needed.    Clotilde Dieter, DO 05/12/2023, 10:14 AM Office: [phone]

## 2023-07-18 IMAGING — MR MR MRA NECK WO/W CM
1 series · 5 of 5 positions shown · IV contrast (14ml Multihance)
Comparison: CTA neck 06/06/2020

CLINICAL DATA: Neck pain. Right shoulder pain. Pseudoaneurysm.

EXAM:
MRA NECK WITHOUT AND WITH CONTRAST
TECHNIQUE: Multiplanar and multiecho pulse sequences of the neck were obtained
without and with intravenous contrast. Angiographic images of the
neck were obtained using MRA technique without and with intravenous
contrast.
CONTRAST:  15mL MULTIHANCE GADOBENATE DIMEGLUMINE 529 MG/ML IV SOLN

[Series 23: verts lt-rt · sagittal · 0.78mm/px · 5 of 5 slices shown]
[im 1/5]
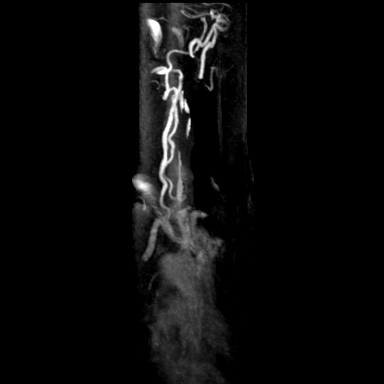
[im 2/5]
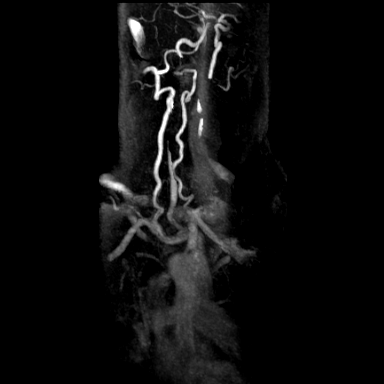
[im 3/5]
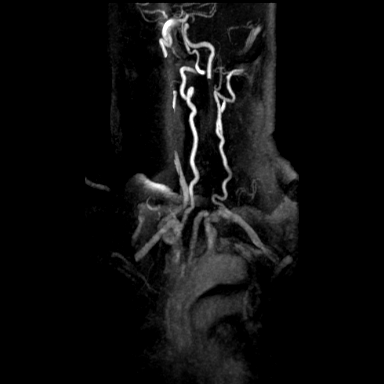
[im 4/5]
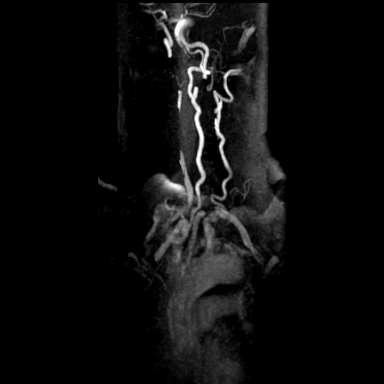
[im 5/5]
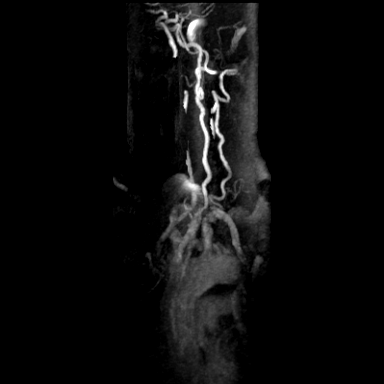

[5 of 5 positions shown; findings below may reference images not displayed]

FINDINGS: 2.2 mm pseudoaneurysm again noted in the mid left ICA at the level
of the loops. Moderate tortuosity is present in the cervical ICA
bilaterally without significant interval change.

CCA bifurcations are otherwise normal bilaterally. No other focal
lesions are present. Three vessel arch configuration is present.

Vertebral arteries are codominant. Both vertebral arteries originate
from the subclavian arteries without significant stenosis.
IMPRESSION: 1. Stable 2.2 mm pseudoaneurysm in the mid left ICA.
2. Moderate tortuosity of the cervical ICA bilaterally without
significant stenosis or change.

## 2024-05-11 ENCOUNTER — Ambulatory Visit: Payer: Medicare HMO | Admitting: Cardiology

## 2024-08-25 ENCOUNTER — Other Ambulatory Visit (HOSPITAL_COMMUNITY): Payer: Self-pay

## 2024-08-25 ENCOUNTER — Ambulatory Visit: Attending: Cardiology | Admitting: Cardiology

## 2024-08-25 ENCOUNTER — Encounter: Payer: Self-pay | Admitting: Cardiology

## 2024-08-25 VITALS — BP 138/81 | HR 67 | Resp 16 | Ht 65.0 in | Wt 148.0 lb

## 2024-08-25 DIAGNOSIS — E7800 Pure hypercholesterolemia, unspecified: Secondary | ICD-10-CM

## 2024-08-25 DIAGNOSIS — R931 Abnormal findings on diagnostic imaging of heart and coronary circulation: Secondary | ICD-10-CM

## 2024-08-25 MED ORDER — EZETIMIBE 10 MG PO TABS
10.0000 mg | ORAL_TABLET | Freq: Every day | ORAL | 0 refills | Status: AC
  Filled 2024-08-25: qty 90, 90d supply, fill #0

## 2024-08-25 NOTE — Progress Notes (Signed)
 Cardiology Office Note:  .   Date:  08/25/2024  ID:  Stacy Spencer, DOB [birth year], MRN [id number] PCP: Dorcus Lamar POUR, MD  [facility] Providers Cardiologist:  Gordy Bergamo, MD   History of Present Illness: Stacy   [name] is a 68 y.o. Female patient with hypercholesterolemia, hyperglycemia, elevated coronary calcium score on 12/19/2023 in the 75 to 90th percentile with a total score of 296, palpitations related to PACs and PVCs diagnosed while in [place]  and presently on metoprolol presents for routine cardiology follow-up to discuss risk stratification and has had carotid artery duplex and coronary CT calcium score recently.  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  No pain, no discoloration.  She has a normal echocardiogram on 04/03/2022 and exercise nuclear stress test for 1923 revealing 7 METS of work load and normal myocardial perfusion scan with EF 87%.  Cardiac Studies relevent.    Outside Carotid artery duplex 12/19/2023: 1.  No hemodynamically significant stenosis on either side.   2.  Both vertebral arteries are patent with antegrade flow.   Coronary calcium score 12/19/2023: 1. The Agatston CAC score is  296. This places the patient between the 52 and 90 percentile for age and gender based on the [facility] data base.  2. CAC is present in the LAD and circumflex.     Discussed the use of AI scribe software for clinical note transcription with the patient, who gave verbal consent to proceed.  History of Present Illness Stacy Spencer is a 68 year old female who presents with leg swelling and chest pain. She is accompanied by her husband.  She experiences intermittent chest pain that occurs spontaneously, even while sitting, and resolves within a few minutes after taking aspirin.  Unrelated to exertion and  occurs very rarely.  Presently asymptomatic.  She noticed swelling in both ankles about two weeks ago, which she attributes to standing for long periods while working at her restaurant. The swelling has since resolved. She has been on atorvastatin 20 mg for many years to manage her cholesterol levels and takes her medication consistently.  Labs   Care everywhere/Faxed External Labs:  Labs 08/11/2024: TSH mildly elevated at 5.857, vitamin D 52.    Labs 05/12/2024:  A1c 6.0%.  TSH normal at 0.881.  Total cholesterol 167, triglycerides 150, HDL 38, LDL 102.  Serum glucose 82 mg, BUN 12, creatinine 0.76, eGFR 85 mL, LFTs normal.  ROS  Review of Systems  Cardiovascular:  Negative for chest pain, dyspnea on exertion and leg swelling.   Physical Exam:   VS:  BP 138/81 (BP Location: Left Arm, Patient Position: Sitting, Cuff Size: Normal)   Pulse 67   Resp 16   Ht 5' 5 (1.651 m)   Wt 148 lb (67.1 kg)   SpO2 98%   BMI 24.63 kg/m    Wt Readings from Last 3 Encounters:  08/25/24 148 lb (67.1 kg)  05/12/23 147 lb 9.6 oz (67 kg)  02/05/23 150 lb (68 kg)    BP Readings from Last 3 Encounters:  08/25/24 138/81  05/12/23 (!) 147/82  02/05/23 118/69   Physical Exam Neck:     Vascular: No carotid bruit or JVD.  Cardiovascular:     Rate and Rhythm: Normal rate and regular rhythm.  Pulses: Intact distal pulses.     Heart sounds: Normal heart sounds. No murmur heard.    No gallop.  Pulmonary:     Effort: Pulmonary effort is normal.     Breath sounds: Normal breath sounds.  Abdominal:     General: Bowel sounds are normal.     Palpations: Abdomen is soft.  Musculoskeletal:     Right lower leg: No edema.     Left lower leg: No edema.    EKG:    EKG Interpretation Date/Time:  Wednesday August 25 2024 14:03:55 EDT Ventricular Rate:  62 PR Interval:  196 QRS Duration:  80 QT Interval:  440 QTC Calculation: 446 R Axis:   29  Text Interpretation: EKG 08/25/2024: Normal sinus  rhythm at rate of 62 bpm, normal EKG.  Compared to 02/05/2023, no change. Reconfirmed by Gem Conkle, Jagadeesh [phone]) on 08/25/2024 2:20:06 PM    ASSESSMENT AND PLAN: .      ICD-10-CM   1. Hypercholesteremia  E78.00 EKG 12-Lead    ezetimibe  (ZETIA ) 10 MG tablet    2. 12/19/2023: The Agatston CAC score is  296. This places the patient between the 75 and 90 percentile in [facility] database  R93.1 ezetimibe  (ZETIA ) 10 MG tablet     Assessment & Plan Atherosclerotic heart disease of native coronary artery without angina pectoris with hyperlipidemia and elevated coronary calcium score Atherosclerotic heart disease with hyperlipidemia and elevated coronary calcium score between 75th and 90th percentile. On atorvastatin 20 mg for many years, but LDL cholesterol not at target level of 70. No angina pectoris.  - Patient worried about progression of coronary disease, her younger sister recently had a stent placed. - Prescribe ezetimibe  10 mg to be taken with atorvastatin 20 mg to further lower LDL cholesterol. - Order cholesterol panel in 2 months when she has a follow-up with her PCP. - Encourage 20-25 minute walk at least 4 days a week. - Coordinate with Dr. Lamar Cornet for follow-up and continuation of treatment.  Intermittent lower extremity edema Intermittent swelling of both ankles noted two weeks ago, now resolved. Likely related to prolonged standing at work. No significant circulatory issues identified. - Advise wearing regular support stockings during work to prevent swelling.  Intermittent non-anginal chest pain Intermittent chest pain not associated with physical exertion, resolves with or without aspirin. Not concerning for angina. No further investigation required at this time. - Encourage regular physical activity to improve cardiovascular health.   Follow up: As needed.  Will request Dr. Lamar Cornet to continue the prescription as deemed appropriate for primary prevention.  Signed,  Gordy Bergamo, MD, [facility] 08/25/2024, 6:14 PM [facility] [address] [place], [place] [address] Phone: [phone]. Fax:  [phone]

## 2024-08-25 NOTE — Patient Instructions (Signed)
 Medication Instructions:  Your physician has recommended you make the following change in your medication: Start Ezetimibe  10 mg by mouth daily  *If you need a refill on your cardiac medications before your next appointment, please call your pharmacy*  Lab Work: none If you have labs (blood work) drawn today and your tests are completely normal, you will receive your results only by: MyChart Message (if you have MyChart) OR A paper copy in the mail If you have any lab test that is abnormal or we need to change your treatment, we will call you to review the results.  Testing/Procedures: none  Follow-Up: At [facility], you and your health needs are our priority.  As part of our continuing mission to provide you with exceptional heart care, our providers are all part of one team.  This team includes your primary Cardiologist (physician) and Advanced Practice Providers or APPs (Physician Assistants and Nurse Practitioners) who all work together to provide you with the care you need, when you need it.  Your next appointment:   As needed  Provider:   Gordy Bergamo, MD    We recommend signing up for the patient portal called MyChart.  Sign up information is provided on this After Visit Summary.  MyChart is used to connect with patients for Virtual Visits (Telemedicine).  Patients are able to view lab/test results, encounter notes, upcoming appointments, etc.  Non-urgent messages can be sent to your provider as well.   To learn more about what you can do with MyChart, go to [email or web address].   Other Instructions
